# Patient Record
Sex: Female | Born: 1940 | Race: White | Hispanic: No | Smoking: Former smoker
Health system: Southern US, Community
[De-identification: ages and names within clinical notes are randomized; demographics above are authoritative.]

## PROBLEM LIST (undated history)

## (undated) DIAGNOSIS — M1990 Unspecified osteoarthritis, unspecified site: Secondary | ICD-10-CM

## (undated) DIAGNOSIS — M797 Fibromyalgia: Secondary | ICD-10-CM

## (undated) HISTORY — PX: BRONCHOSCOPY: SUR163

## (undated) HISTORY — PX: OTHER SURGICAL HISTORY: SHX169

## (undated) HISTORY — PX: TONSILLECTOMY: SUR1361

---

## 1998-05-19 ENCOUNTER — Other Ambulatory Visit: Admission: RE | Admit: 1998-05-19 | Discharge: 1998-05-19 | Payer: Self-pay | Admitting: Obstetrics and Gynecology

## 1998-07-03 ENCOUNTER — Other Ambulatory Visit: Admission: RE | Admit: 1998-07-03 | Discharge: 1998-07-03 | Payer: Self-pay | Admitting: Obstetrics and Gynecology

## 1998-08-26 ENCOUNTER — Encounter: Payer: Self-pay | Admitting: Obstetrics and Gynecology

## 1998-08-26 ENCOUNTER — Ambulatory Visit (HOSPITAL_COMMUNITY): Admission: RE | Admit: 1998-08-26 | Discharge: 1998-08-26 | Payer: Self-pay | Admitting: Obstetrics and Gynecology

## 1999-08-09 ENCOUNTER — Other Ambulatory Visit: Admission: RE | Admit: 1999-08-09 | Discharge: 1999-08-09 | Payer: Self-pay | Admitting: Obstetrics and Gynecology

## 1999-09-28 ENCOUNTER — Ambulatory Visit (HOSPITAL_COMMUNITY): Admission: RE | Admit: 1999-09-28 | Discharge: 1999-09-28 | Payer: Self-pay | Admitting: Obstetrics and Gynecology

## 1999-09-28 ENCOUNTER — Encounter: Payer: Self-pay | Admitting: Obstetrics and Gynecology

## 2000-09-21 ENCOUNTER — Other Ambulatory Visit: Admission: RE | Admit: 2000-09-21 | Discharge: 2000-09-21 | Payer: Self-pay | Admitting: Obstetrics and Gynecology

## 2000-10-04 ENCOUNTER — Encounter: Payer: Self-pay | Admitting: Obstetrics and Gynecology

## 2000-10-04 ENCOUNTER — Ambulatory Visit (HOSPITAL_COMMUNITY): Admission: RE | Admit: 2000-10-04 | Discharge: 2000-10-04 | Payer: Self-pay | Admitting: Obstetrics and Gynecology

## 2000-10-29 ENCOUNTER — Emergency Department (HOSPITAL_COMMUNITY): Admission: EM | Admit: 2000-10-29 | Discharge: 2000-10-29 | Payer: Self-pay | Admitting: Emergency Medicine

## 2000-10-30 ENCOUNTER — Encounter: Payer: Self-pay | Admitting: Emergency Medicine

## 2000-10-30 ENCOUNTER — Emergency Department (HOSPITAL_COMMUNITY): Admission: EM | Admit: 2000-10-30 | Discharge: 2000-10-30 | Payer: Self-pay | Admitting: Emergency Medicine

## 2001-12-18 ENCOUNTER — Other Ambulatory Visit: Admission: RE | Admit: 2001-12-18 | Discharge: 2001-12-18 | Payer: Self-pay | Admitting: Obstetrics and Gynecology

## 2003-03-13 ENCOUNTER — Other Ambulatory Visit: Admission: RE | Admit: 2003-03-13 | Discharge: 2003-03-13 | Payer: Self-pay | Admitting: Obstetrics and Gynecology

## 2004-03-16 ENCOUNTER — Emergency Department (HOSPITAL_COMMUNITY): Admission: EM | Admit: 2004-03-16 | Discharge: 2004-03-16 | Payer: Self-pay | Admitting: Emergency Medicine

## 2004-03-31 ENCOUNTER — Emergency Department (HOSPITAL_COMMUNITY): Admission: EM | Admit: 2004-03-31 | Discharge: 2004-03-31 | Payer: Self-pay | Admitting: Family Medicine

## 2004-04-01 ENCOUNTER — Ambulatory Visit (HOSPITAL_COMMUNITY): Admission: RE | Admit: 2004-04-01 | Discharge: 2004-04-01 | Payer: Self-pay | Admitting: Family Medicine

## 2004-04-02 ENCOUNTER — Ambulatory Visit (HOSPITAL_COMMUNITY): Admission: RE | Admit: 2004-04-02 | Discharge: 2004-04-02 | Payer: Self-pay | Admitting: Family Medicine

## 2004-04-13 ENCOUNTER — Ambulatory Visit (HOSPITAL_COMMUNITY): Admission: RE | Admit: 2004-04-13 | Discharge: 2004-04-13 | Payer: Self-pay | Admitting: Thoracic Surgery

## 2004-04-15 ENCOUNTER — Encounter (INDEPENDENT_AMBULATORY_CARE_PROVIDER_SITE_OTHER): Payer: Self-pay | Admitting: *Deleted

## 2004-04-15 ENCOUNTER — Ambulatory Visit (HOSPITAL_COMMUNITY): Admission: RE | Admit: 2004-04-15 | Discharge: 2004-04-15 | Payer: Self-pay | Admitting: Thoracic Surgery

## 2004-05-05 ENCOUNTER — Encounter: Admission: RE | Admit: 2004-05-05 | Discharge: 2004-05-05 | Payer: Self-pay | Admitting: Thoracic Surgery

## 2004-06-16 ENCOUNTER — Encounter: Admission: RE | Admit: 2004-06-16 | Discharge: 2004-06-16 | Payer: Self-pay | Admitting: Thoracic Surgery

## 2004-06-21 ENCOUNTER — Other Ambulatory Visit: Admission: RE | Admit: 2004-06-21 | Discharge: 2004-06-21 | Payer: Self-pay | Admitting: Obstetrics and Gynecology

## 2005-06-13 IMAGING — CR DG CHEST 2V
2 series · 2 of 2 positions shown · non-contrast
Comparison: Chest radiographs dated 04/15/04 and chest CT dated 04/02/04.

CLINICAL DATA: Status post bronchoscopy for right middle lobe and right lower lobe lesions.
 TWO VIEW CHEST ? 05/05/04:

[view not recorded (1 of 2)]
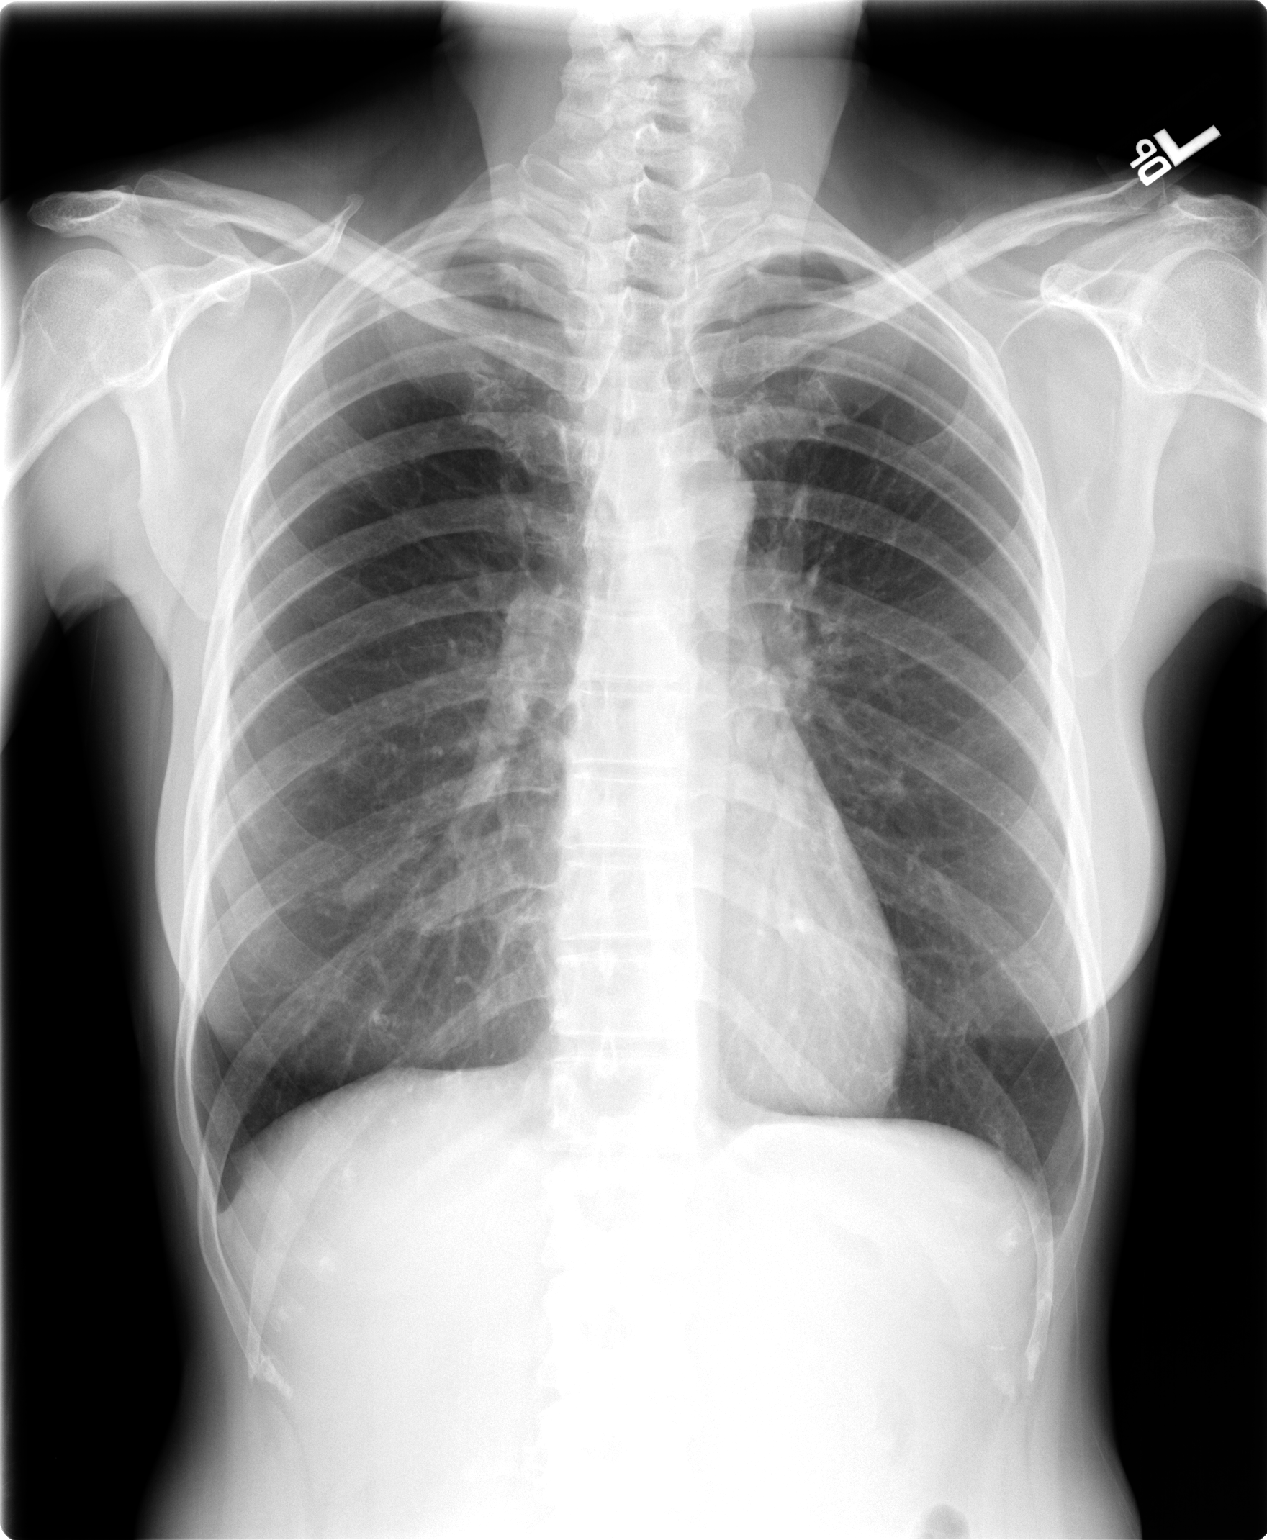

[view not recorded (2 of 2)]
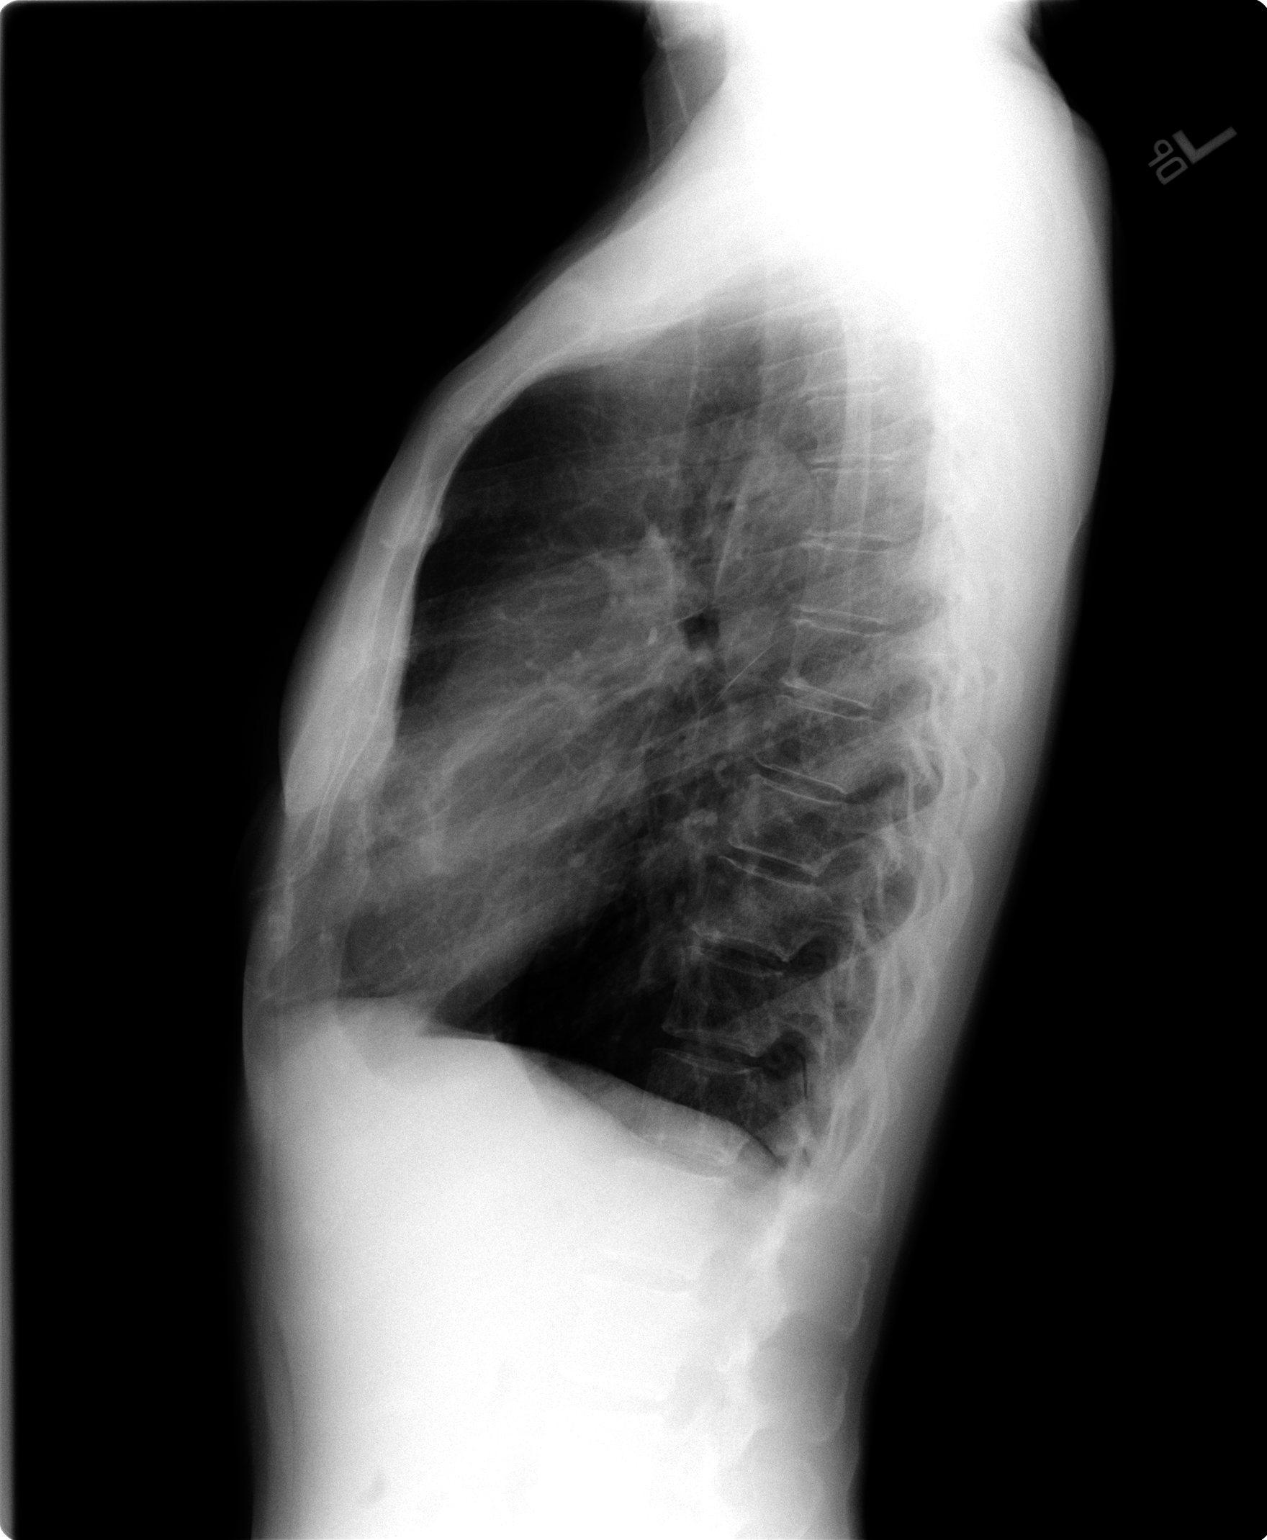

[2 of 2 positions shown; findings below may reference images not displayed]

Patchy density in the anterior aspect of the right middle lobe with an interval decrease in density with an appearance compatible with interval partial aeration of this region.  The nodular densities seen on the CT in the right lower lobe are not visible on the radiographs.  Stable mild changes of COPD.  Normal sized heart.  Mild thoracic spine degenerative changes.
IMPRESSION: 1.  Patchy opacity in the anterior aspect of the right middle lobe with interval partial aeration of that area, suggesting improving pneumonia or inflammatory change.
 2.  Stable mild changes of COPD.

## 2005-08-25 ENCOUNTER — Other Ambulatory Visit: Admission: RE | Admit: 2005-08-25 | Discharge: 2005-08-25 | Payer: Self-pay | Admitting: Obstetrics and Gynecology

## 2005-12-27 ENCOUNTER — Ambulatory Visit: Payer: Self-pay | Admitting: Nurse Practitioner

## 2005-12-28 ENCOUNTER — Ambulatory Visit: Payer: Self-pay | Admitting: *Deleted

## 2006-01-03 ENCOUNTER — Ambulatory Visit (HOSPITAL_COMMUNITY): Admission: RE | Admit: 2006-01-03 | Discharge: 2006-01-03 | Payer: Self-pay | Admitting: Family Medicine

## 2006-01-20 ENCOUNTER — Ambulatory Visit: Payer: Self-pay | Admitting: Nurse Practitioner

## 2006-01-20 ENCOUNTER — Ambulatory Visit (HOSPITAL_COMMUNITY): Admission: RE | Admit: 2006-01-20 | Discharge: 2006-01-20 | Payer: Self-pay | Admitting: Nurse Practitioner

## 2006-02-06 ENCOUNTER — Ambulatory Visit: Payer: Self-pay | Admitting: Nurse Practitioner

## 2006-03-06 ENCOUNTER — Ambulatory Visit: Payer: Self-pay | Admitting: Nurse Practitioner

## 2006-03-24 ENCOUNTER — Ambulatory Visit: Payer: Self-pay | Admitting: Nurse Practitioner

## 2006-11-30 ENCOUNTER — Other Ambulatory Visit: Admission: RE | Admit: 2006-11-30 | Discharge: 2006-11-30 | Payer: Self-pay | Admitting: Obstetrics and Gynecology

## 2006-12-19 ENCOUNTER — Ambulatory Visit: Payer: Self-pay | Admitting: Internal Medicine

## 2007-02-28 ENCOUNTER — Encounter (INDEPENDENT_AMBULATORY_CARE_PROVIDER_SITE_OTHER): Payer: Self-pay | Admitting: *Deleted

## 2007-02-28 IMAGING — CR DG ANKLE COMPLETE 3+V*R*
3 series · 3 of 3 positions shown · non-contrast
Comparison: none

CLINICAL DATA: Right foot and ankle pain.
 RIGHT FOOT - 3 VIEW:
 There is mild degenerative change at the first metatarsal phalangeal articulation.  Additionally, mild degenerative changes of the tibiotalar articulation.  Negative for fracture.

[t ankle joint ap right]
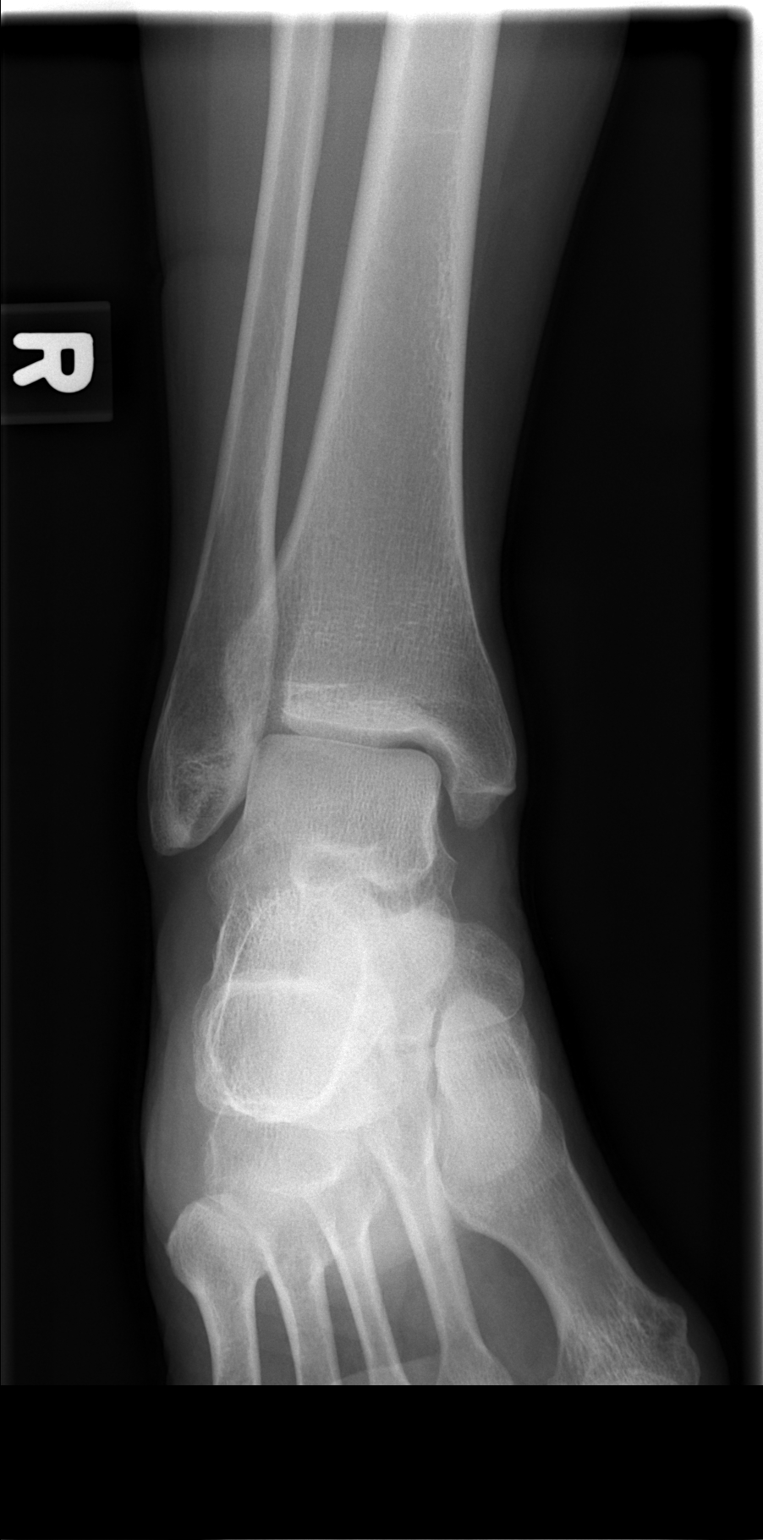

[t ankle joint oblique right]
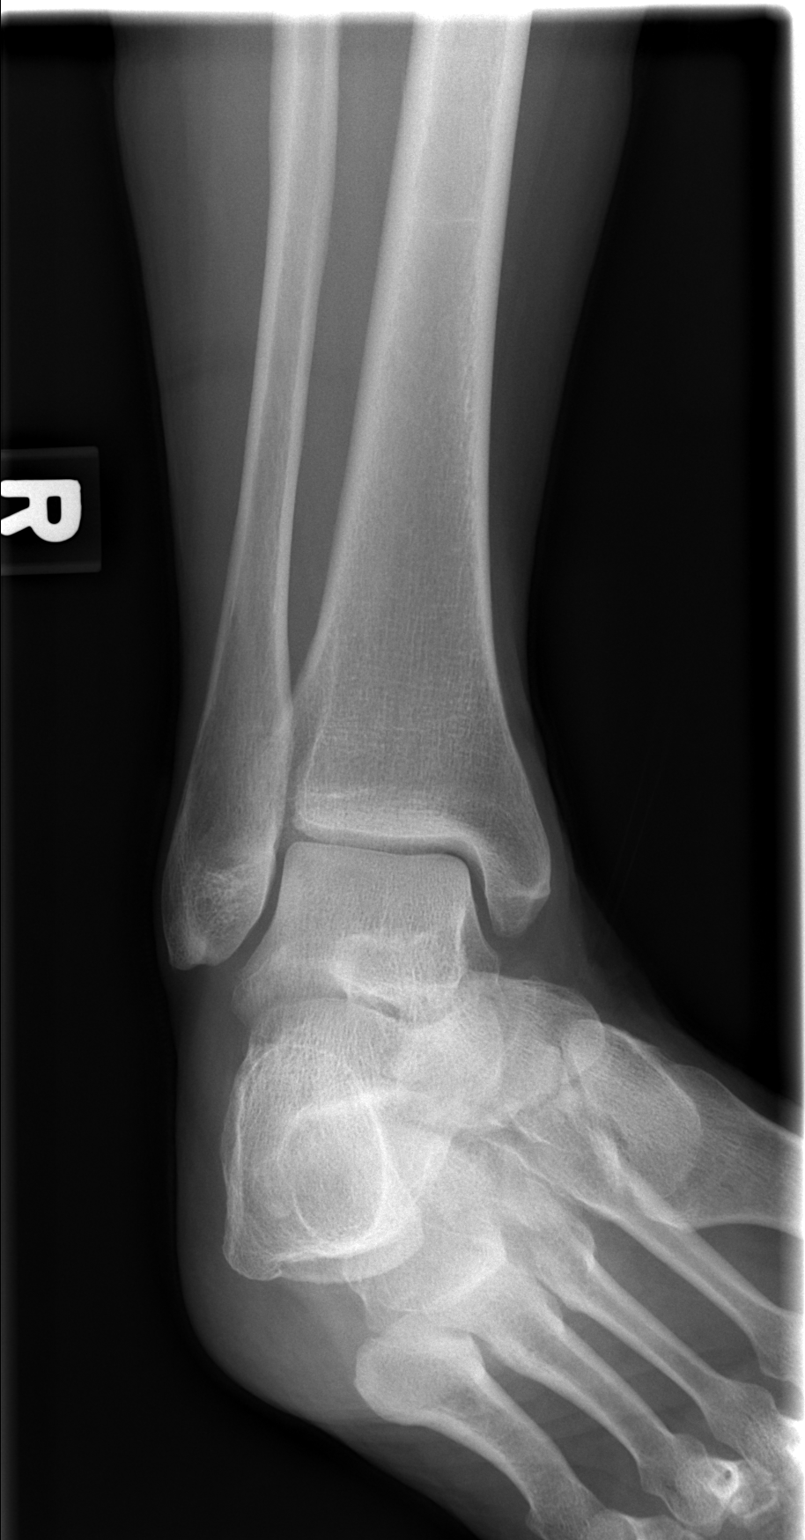

[t ankle joint lat right]
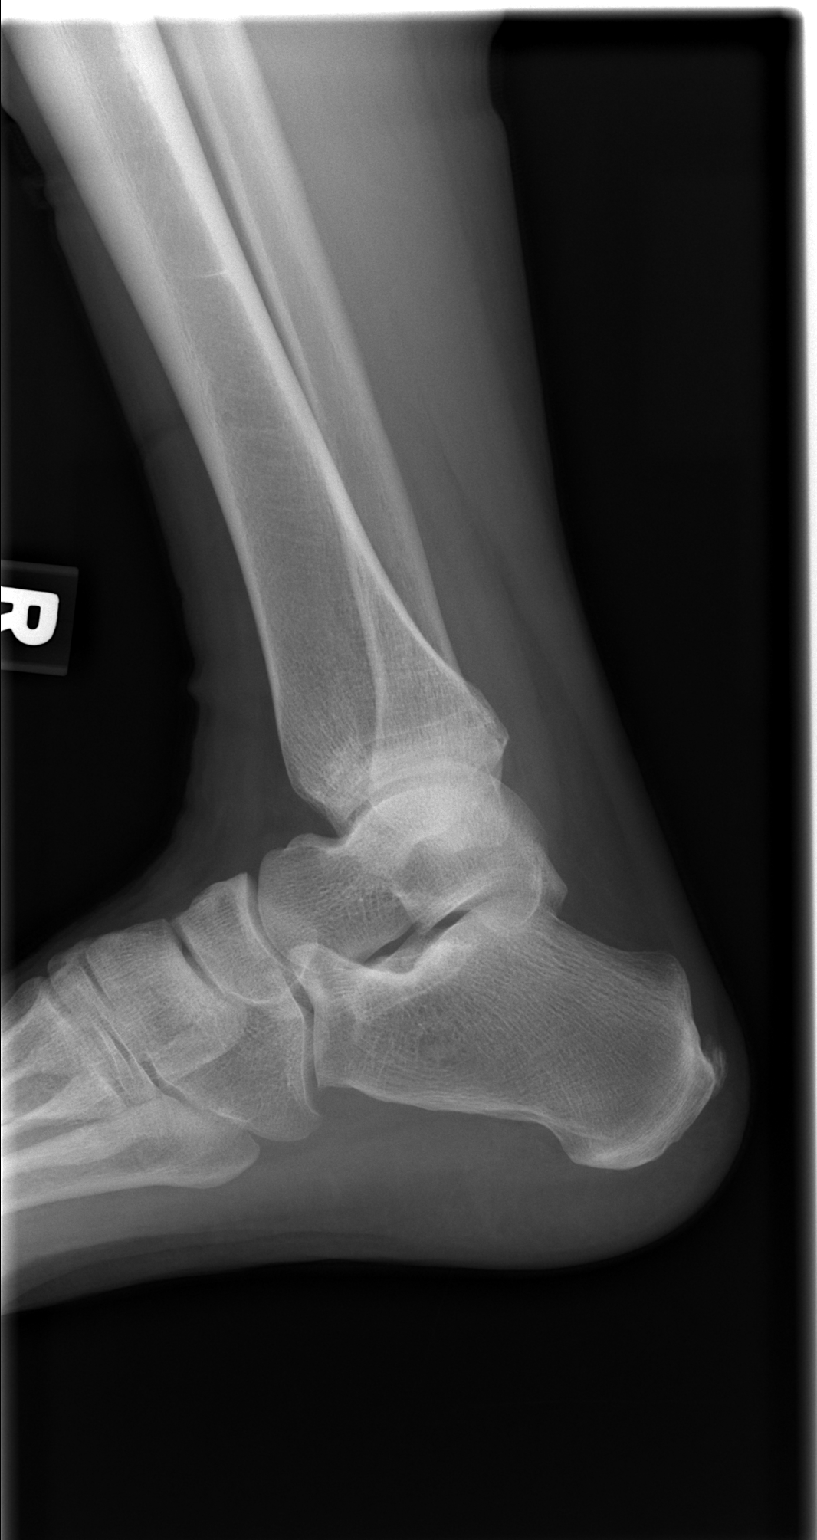

[3 of 3 positions shown; findings below may reference images not displayed]

IMPRESSION: Degenerative changes without fracture.
 RIGHT ANKLE - 3 VIEW:
FINDINGS: Mild tibiotalar degenerative changes noted.  Cortical irregularity posterior tibia.  I question whether this is related to chronic injury.  Negative for acute fracture.  Subtalar articulation is normal.  A lucency within the calcaneus with central nidus likely reflects an incidental intraosseous lipoma.
IMPRESSION: Degenerative changes at the tibiotalar articulation.  
 Probable incidental lipoma within the calcaneus.

## 2007-03-15 ENCOUNTER — Ambulatory Visit: Payer: Self-pay | Admitting: Family Medicine

## 2007-03-15 ENCOUNTER — Encounter (INDEPENDENT_AMBULATORY_CARE_PROVIDER_SITE_OTHER): Payer: Self-pay | Admitting: Nurse Practitioner

## 2007-05-15 ENCOUNTER — Emergency Department (HOSPITAL_COMMUNITY): Admission: EM | Admit: 2007-05-15 | Discharge: 2007-05-15 | Payer: Self-pay | Admitting: Family Medicine

## 2007-08-10 ENCOUNTER — Ambulatory Visit (HOSPITAL_COMMUNITY): Admission: RE | Admit: 2007-08-10 | Discharge: 2007-08-10 | Payer: Self-pay | Admitting: Family Medicine

## 2007-08-24 ENCOUNTER — Encounter: Admission: RE | Admit: 2007-08-24 | Discharge: 2007-08-24 | Payer: Self-pay | Admitting: Family Medicine

## 2008-03-27 ENCOUNTER — Emergency Department (HOSPITAL_COMMUNITY): Admission: EM | Admit: 2008-03-27 | Discharge: 2008-03-27 | Payer: Self-pay | Admitting: Family Medicine

## 2010-10-29 NOTE — Op Note (Signed)
NAME:  Lauren Terrell, Lauren Terrell                 ACCOUNT NO.:  [id number]   MEDICAL RECORD NO.:  [id number]          PATIENT TYPE:  OUT   LOCATION:  XRAY                         FACILITY:  [facility]   PHYSICIAN:  Ines Bloomer, M.D. DATE OF BIRTH:  [birth year]   DATE OF PROCEDURE:  04/15/2004  DATE OF DISCHARGE:  04/13/2004                                 OPERATIVE REPORT   PREOPERATIVE DIAGNOSIS:  Right middle lobe and right lower lobe nodules.   POSTOPERATIVE DIAGNOSIS:  Right middle lobe and right lower lobe nodules.   OPERATION PERFORMED:  Video bronchoscopy.   SURGEON:  Ines Bloomer, M.D.   ANESTHESIA:  MAC.  Cetacaine and Xylocaine and IV sedation.   INDICATIONS FOR PROCEDURE:  This patient had multiple nodules in the right  middle lobe and the right lower lobe that appeared may possibly be  inflammatory.  There is a question she may have a right middle lobe  occlusion and so is scheduled for video bronchoscopy for diagnosis and  culture.   DESCRIPTION OF PROCEDURE:  The video bronchoscope was passed through the  mouth.  The cords were normal.  The trachea was normal. The left main stem,  left upper lobe, and left lower lobe orifices were normal.  The right  mainstem, right upper lobe orifices were normal.  On the right middle lobe  no endobronchial lesions were seen.  There was maybe some slight narrowing.  Washings and brushings were taken from the right middle lobe and the right  lower lobe.  There was nothing to biopsy.  Since there was nothing  intrabronchial to biopsy, we did some transbronchial biopsies in the area of  the greatest nodules in the right middle lobe as well as the right lower  lobe.  The patient tolerated the procedure.  The video bronchoscope was  removed.  The patient tolerated the procedure well and was returned to the  recovery room in stable condition.       DPB/MEDQ  D:  05/31/2004  T:  06/01/2004  Job:  [id number]

## 2012-06-28 DIAGNOSIS — IMO0001 Reserved for inherently not codable concepts without codable children: Secondary | ICD-10-CM | POA: Diagnosis not present

## 2012-06-28 DIAGNOSIS — I776 Arteritis, unspecified: Secondary | ICD-10-CM | POA: Diagnosis not present

## 2012-11-07 DIAGNOSIS — H2510 Age-related nuclear cataract, unspecified eye: Secondary | ICD-10-CM | POA: Diagnosis not present

## 2012-11-26 DIAGNOSIS — H2510 Age-related nuclear cataract, unspecified eye: Secondary | ICD-10-CM | POA: Diagnosis not present

## 2012-12-04 DIAGNOSIS — H2510 Age-related nuclear cataract, unspecified eye: Secondary | ICD-10-CM | POA: Diagnosis not present

## 2012-12-10 DIAGNOSIS — H2510 Age-related nuclear cataract, unspecified eye: Secondary | ICD-10-CM | POA: Diagnosis not present

## 2013-02-06 ENCOUNTER — Ambulatory Visit (INDEPENDENT_AMBULATORY_CARE_PROVIDER_SITE_OTHER): Payer: Medicare Other | Admitting: Sports Medicine

## 2013-02-06 VITALS — BP 120/86 | Ht 61.0 in | Wt 115.0 lb

## 2013-02-06 DIAGNOSIS — M533 Sacrococcygeal disorders, not elsewhere classified: Secondary | ICD-10-CM | POA: Diagnosis not present

## 2013-02-06 MED ORDER — METHYLPREDNISOLONE ACETATE 80 MG/ML IJ SUSP
80.0000 mg | Freq: Once | INTRAMUSCULAR | Status: AC
  Administered 2013-02-06: 80 mg via INTRAMUSCULAR
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
## 2013-02-14 DIAGNOSIS — M069 Rheumatoid arthritis, unspecified: Secondary | ICD-10-CM | POA: Diagnosis not present

## 2013-02-14 DIAGNOSIS — E28319 Asymptomatic premature menopause: Secondary | ICD-10-CM | POA: Diagnosis not present

## 2013-05-01 DIAGNOSIS — Z23 Encounter for immunization: Secondary | ICD-10-CM | POA: Diagnosis not present

## 2013-07-08 DIAGNOSIS — E785 Hyperlipidemia, unspecified: Secondary | ICD-10-CM | POA: Diagnosis not present

## 2013-07-08 DIAGNOSIS — E28319 Asymptomatic premature menopause: Secondary | ICD-10-CM | POA: Diagnosis not present

## 2013-07-11 DIAGNOSIS — E785 Hyperlipidemia, unspecified: Secondary | ICD-10-CM | POA: Diagnosis not present

## 2013-07-11 DIAGNOSIS — R7309 Other abnormal glucose: Secondary | ICD-10-CM | POA: Diagnosis not present

## 2013-07-11 DIAGNOSIS — R5381 Other malaise: Secondary | ICD-10-CM | POA: Diagnosis not present

## 2013-07-11 DIAGNOSIS — R5383 Other fatigue: Secondary | ICD-10-CM | POA: Diagnosis not present

## 2013-08-16 DIAGNOSIS — IMO0001 Reserved for inherently not codable concepts without codable children: Secondary | ICD-10-CM | POA: Diagnosis not present

## 2013-08-16 DIAGNOSIS — M1990 Unspecified osteoarthritis, unspecified site: Secondary | ICD-10-CM | POA: Diagnosis not present

## 2014-01-23 DIAGNOSIS — Z23 Encounter for immunization: Secondary | ICD-10-CM | POA: Diagnosis not present

## 2014-01-23 DIAGNOSIS — Z780 Asymptomatic menopausal state: Secondary | ICD-10-CM | POA: Diagnosis not present

## 2014-01-23 DIAGNOSIS — IMO0001 Reserved for inherently not codable concepts without codable children: Secondary | ICD-10-CM | POA: Diagnosis not present

## 2014-01-23 DIAGNOSIS — G4700 Insomnia, unspecified: Secondary | ICD-10-CM | POA: Diagnosis not present

## 2014-03-11 DIAGNOSIS — Z23 Encounter for immunization: Secondary | ICD-10-CM | POA: Diagnosis not present

## 2014-07-30 ENCOUNTER — Other Ambulatory Visit: Payer: Self-pay | Admitting: Obstetrics & Gynecology

## 2014-07-30 ENCOUNTER — Other Ambulatory Visit (HOSPITAL_COMMUNITY)
Admission: RE | Admit: 2014-07-30 | Discharge: 2014-07-30 | Disposition: A | Discharge status: Home / Self Care | Payer: Medicare Other | Source: Ambulatory Visit | Attending: Obstetrics & Gynecology | Admitting: Obstetrics & Gynecology

## 2014-07-30 DIAGNOSIS — N939 Abnormal uterine and vaginal bleeding, unspecified: Secondary | ICD-10-CM | POA: Diagnosis not present

## 2014-07-30 DIAGNOSIS — N959 Unspecified menopausal and perimenopausal disorder: Secondary | ICD-10-CM | POA: Diagnosis not present

## 2014-07-30 DIAGNOSIS — Z124 Encounter for screening for malignant neoplasm of cervix: Secondary | ICD-10-CM | POA: Insufficient documentation

## 2014-07-30 DIAGNOSIS — Z01411 Encounter for gynecological examination (general) (routine) with abnormal findings: Secondary | ICD-10-CM | POA: Diagnosis not present

## 2014-07-30 DIAGNOSIS — N63 Unspecified lump in breast: Secondary | ICD-10-CM | POA: Diagnosis not present

## 2014-07-30 DIAGNOSIS — Z1151 Encounter for screening for human papillomavirus (HPV): Secondary | ICD-10-CM | POA: Insufficient documentation

## 2014-07-30 DIAGNOSIS — F604 Histrionic personality disorder: Secondary | ICD-10-CM | POA: Diagnosis not present

## 2014-07-31 ENCOUNTER — Other Ambulatory Visit: Payer: Self-pay | Admitting: Obstetrics & Gynecology

## 2014-07-31 DIAGNOSIS — N6310 Unspecified lump in the right breast, unspecified quadrant: Secondary | ICD-10-CM

## 2014-08-01 LAB — CYTOLOGY - PAP

## 2014-08-06 ENCOUNTER — Other Ambulatory Visit: Payer: Medicare Other

## 2014-08-13 ENCOUNTER — Ambulatory Visit
Admission: RE | Admit: 2014-08-13 | Discharge: 2014-08-13 | Disposition: A | Discharge status: Home / Self Care | Payer: Medicare Other | Source: Ambulatory Visit | Attending: Obstetrics & Gynecology | Admitting: Obstetrics & Gynecology

## 2014-08-13 DIAGNOSIS — N6310 Unspecified lump in the right breast, unspecified quadrant: Secondary | ICD-10-CM

## 2014-08-13 DIAGNOSIS — N6011 Diffuse cystic mastopathy of right breast: Secondary | ICD-10-CM | POA: Diagnosis not present

## 2014-08-27 DIAGNOSIS — N959 Unspecified menopausal and perimenopausal disorder: Secondary | ICD-10-CM | POA: Diagnosis not present

## 2014-08-27 DIAGNOSIS — R938 Abnormal findings on diagnostic imaging of other specified body structures: Secondary | ICD-10-CM | POA: Diagnosis not present

## 2014-08-27 DIAGNOSIS — N939 Abnormal uterine and vaginal bleeding, unspecified: Secondary | ICD-10-CM | POA: Diagnosis not present

## 2014-08-27 DIAGNOSIS — Z780 Asymptomatic menopausal state: Secondary | ICD-10-CM | POA: Diagnosis not present

## 2014-09-19 DIAGNOSIS — N939 Abnormal uterine and vaginal bleeding, unspecified: Secondary | ICD-10-CM | POA: Diagnosis not present

## 2014-09-21 NOTE — H&P (Signed)
GYN H&P 74yo G2P2 who presents for hysteroscopy, D&C, polypectomy due to abnormal uterine bleeding. In review, the patient has been on HRT for over 88yrs, with vaginal bleeding q 30 days-lasting for 3-4 days with two days of moderate bleeding. Suspect bleeding is due to progesterone withdrawal; however in light of this bleeding with no prior work up. A TVUS was peformed on 3/16 that showed: Anteverted uterus measuring 9.5cmx4.95cmx5.4cm. Two small fibroids each measuring <2cm, endometrium thickened 0.9cm with questionable hyperechoic mass on the the posterior wall 0.7x0.5cm, no BF noted. Small follicle in left ovary, otherwise adnexal unremarkable. Due to the patient's desire to continue with HRT, this treatment has been discontinued until a full work up of her bleeding has been performed.   Current Medications  Taking   Melatonin 5 MG Tablet 1 tablet at bedtime as needed with food Once a day   Unisom(Doxylamine Succinate) 25 MG Tablet 1 tablet at bedtime as needed Once a day   Diclofenac Sodium 75 MG Tablet Delayed Release 1 tablet Once a day   Lyrica(Pregabalin) 75 MG Capsule 1 capsule once a day   Discontinued   Estradiol 1 MG Tablet 1 tablet once a day   MedroxyPROGESTERone Acetate 5 MG Tablet 1 tablet, on the 18th day of every month until the 30th Once a day   Medication List reviewed and reconciled with the patient    Past Medical History  Fibromyalgia  Genital herpes  Urinary incontinence since 2010  Insomnia  Histrionic personality   Surgical History  cataracts in both eyes 2014  C section 89  bronchosocopy    Family History  Father: deceased  Mother: deceased  Father had ulcers. , denies any GYN family cancer hx.   Social History  General:  Tobacco use  cigarettes: Former smoker quit 10 years ago Alcohol: wine, 2 glasses per night.  Caffeine: no.  Recreational drug use: no.  Diet: 5 servings of fruits and veggies daily, drinks water, salmon, sardines.  Exercise: walk,  light weights and stretching, daily.  Occupation: retired.  Education: [employer].  Marital Status: Divorced.  Children: 2 sons.    Gyn History  Sexual activity not currently sexually active.  Periods : menses never discontinued.  LMP 07/16/2014.  Last pap smear date 07/30/2014.  Last mammogram date 08/2014.  Denies H/O Abnormal pap smear.    OB History  Pregnancy # 1 live birth, vaginal delivery, boy.  Pregnancy # 2 live birth, C-section, boy.    Allergies  Penicillin: anaphylaxis: Allergy  Ibuprofen: stomach upset: Side Effects   Hospitalization/Major Diagnostic Procedure  No Hospitalization History.   Review of Systems  CONSTITUTIONAL:  no Chills. no Fever. no Skin rash.  HEENT:  Blurrred vision no.  CARDIOLOGY:  no Chest pain.  RESPIRATORY:  no Shortness of breath. no Cough.  GASTROENTEROLOGY:  no Abdominal pain. no Appetite change. no Change in bowel movements.  UROLOGY:  no Urinary frequency. no Urinary incontinence. no Urinary urgency.  FEMALE REPRODUCTIVE:  no Breast lumps or discharge. no Breast pain.  NEUROLOGY:  no Dizziness. no Headache.     Examination performed in office 09/19/14: Vital Signs  Wt 117.5, Wt change -.5 lb, Ht 65.5, BMI 19.25, Pulse sitting 68, BP sitting 131/79.   Examination  General Examination: GENERAL APPEARANCE alert, oriented, NAD, pleasant.  SKIN: normal, no rash.  LUNGS: clear to auscultation bilaterally, no wheezes, rhonchi, rales.  HEART: no murmurs, regular rate and rhythm.  ABDOMEN: no masses palpated, soft and not tender, no  rebound, no guarding.  FEMALE GENITOURINARY: No external lesions, Vagina - pink moist mucosa, no lesions or abnormal discharge, cervix - closed, no discharge or lesions. No CMT. No adnexal masses bilaterally. Uterus: nontender and normal size on palpation.  EXTREMITIES: no edema present, no calf tenderness bilaterally.     A/P: 74yo G2P2 who presents for hysteroscopy, D&C, possible polypectomy  due to abnormal uterine bleeding and possible uterine polyp -NPO -LR @ 125cc/hr -CBC prior to procedure -SCDs to OR -Discussed risks and benefits of surgery- including risk of bleeding, infection and uterine perforation. Discussed indications of surgery and pt wishes to proceed with surgical management.   Myna HidalgoJennifer Wallace Gappa, DO [phone] (pager) [phone] (office)

## 2014-09-22 ENCOUNTER — Encounter (HOSPITAL_COMMUNITY)
Admission: RE | Admit: 2014-09-22 | Discharge: 2014-09-22 | Disposition: A | Discharge status: Home / Self Care | Payer: Medicare Other | Source: Ambulatory Visit | Attending: Obstetrics & Gynecology | Admitting: Obstetrics & Gynecology

## 2014-09-22 ENCOUNTER — Inpatient Hospital Stay (HOSPITAL_COMMUNITY): Admission: RE | Admit: 2014-09-22 | Payer: Medicare Other | Source: Ambulatory Visit

## 2014-09-22 ENCOUNTER — Other Ambulatory Visit: Payer: Self-pay

## 2014-09-22 ENCOUNTER — Encounter (HOSPITAL_COMMUNITY): Payer: Self-pay

## 2014-09-22 DIAGNOSIS — Z01818 Encounter for other preprocedural examination: Secondary | ICD-10-CM | POA: Insufficient documentation

## 2014-09-22 HISTORY — DX: Unspecified osteoarthritis, unspecified site: M19.90

## 2014-09-22 HISTORY — DX: Fibromyalgia: M79.7

## 2014-09-22 LAB — CBC
HEMATOCRIT: 34.9 % — AB (ref 36.0–46.0)
Hemoglobin: 11.8 g/dL — ABNORMAL LOW (ref 12.0–15.0)
MCH: 32.2 pg (ref 26.0–34.0)
MCHC: 33.8 g/dL (ref 30.0–36.0)
MCV: 95.4 fL (ref 78.0–100.0)
Platelets: 193 10*3/uL (ref 150–400)
RBC: 3.66 MIL/uL — ABNORMAL LOW (ref 3.87–5.11)
RDW: 15.2 % (ref 11.5–15.5)
WBC: 5.2 10*3/uL (ref 4.0–10.5)

## 2014-09-22 NOTE — Patient Instructions (Addendum)
Your procedure is scheduled on:10/01/14  Enter through the Main Entrance at :1230pm Pick up desk phone and dial [phone] and inform us of your arrival.  Please call [phone] if you have any problems the morning of surgery.  Remember: Do not eat food after midnight:Tuesday Clear liquids ok until 10 am on WED   You may brush your teeth the morning of surgery.   DO NOT wear jewelry, eye make-up, lipstick,body lotion, or dark fingernail polish.  (Polished toes are ok) You may wear deodorant.  If you are to be admitted after surgery, leave suitcase in car until your room has been assigned. Patients discharged on the day of surgery will not be allowed to drive home. Wear loose fitting, comfortable clothes for your ride home.

## 2014-10-01 ENCOUNTER — Encounter (HOSPITAL_COMMUNITY): Payer: Self-pay | Admitting: Certified Registered Nurse Anesthetist

## 2014-10-01 ENCOUNTER — Ambulatory Visit (HOSPITAL_COMMUNITY): Payer: Medicare Other | Admitting: Anesthesiology

## 2014-10-01 ENCOUNTER — Ambulatory Visit (HOSPITAL_COMMUNITY)
Admission: RE | Admit: 2014-10-01 | Discharge: 2014-10-01 | Disposition: A | Discharge status: Home / Self Care | Payer: Medicare Other | Source: Ambulatory Visit | Attending: Obstetrics & Gynecology | Admitting: Obstetrics & Gynecology

## 2014-10-01 ENCOUNTER — Encounter (HOSPITAL_COMMUNITY)
Admission: RE | Disposition: A | Discharge status: Home / Self Care | Payer: Self-pay | Source: Ambulatory Visit | Attending: Obstetrics & Gynecology

## 2014-10-01 DIAGNOSIS — R938 Abnormal findings on diagnostic imaging of other specified body structures: Secondary | ICD-10-CM | POA: Insufficient documentation

## 2014-10-01 DIAGNOSIS — N840 Polyp of corpus uteri: Secondary | ICD-10-CM | POA: Diagnosis not present

## 2014-10-01 DIAGNOSIS — Z87891 Personal history of nicotine dependence: Secondary | ICD-10-CM | POA: Diagnosis not present

## 2014-10-01 DIAGNOSIS — N888 Other specified noninflammatory disorders of cervix uteri: Secondary | ICD-10-CM | POA: Insufficient documentation

## 2014-10-01 DIAGNOSIS — N939 Abnormal uterine and vaginal bleeding, unspecified: Secondary | ICD-10-CM | POA: Diagnosis not present

## 2014-10-01 DIAGNOSIS — M797 Fibromyalgia: Secondary | ICD-10-CM | POA: Diagnosis not present

## 2014-10-01 DIAGNOSIS — G4700 Insomnia, unspecified: Secondary | ICD-10-CM | POA: Insufficient documentation

## 2014-10-01 DIAGNOSIS — Z79899 Other long term (current) drug therapy: Secondary | ICD-10-CM | POA: Insufficient documentation

## 2014-10-01 DIAGNOSIS — M1990 Unspecified osteoarthritis, unspecified site: Secondary | ICD-10-CM | POA: Diagnosis not present

## 2014-10-01 DIAGNOSIS — Z791 Long term (current) use of non-steroidal anti-inflammatories (NSAID): Secondary | ICD-10-CM | POA: Diagnosis not present

## 2014-10-01 DIAGNOSIS — N950 Postmenopausal bleeding: Secondary | ICD-10-CM | POA: Diagnosis present

## 2014-10-01 HISTORY — PX: DILATION AND CURETTAGE OF UTERUS: SHX78

## 2014-10-01 HISTORY — PX: HYSTEROSCOPY: SHX211

## 2014-10-01 SURGERY — DILATION AND CURETTAGE
Anesthesia: General | Site: Vagina

## 2014-10-01 MED ORDER — FENTANYL CITRATE (PF) 250 MCG/5ML IJ SOLN
INTRAMUSCULAR | Status: AC
  Filled 2014-10-01: qty 5

## 2014-10-01 MED ORDER — DEXAMETHASONE SODIUM PHOSPHATE 4 MG/ML IJ SOLN
INTRAMUSCULAR | Status: AC
  Filled 2014-10-01: qty 1

## 2014-10-01 MED ORDER — LIDOCAINE HCL (CARDIAC) 20 MG/ML IV SOLN
INTRAVENOUS | Status: DC | PRN
  Administered 2014-10-01: 100 mg via INTRAVENOUS

## 2014-10-01 MED ORDER — MIDAZOLAM HCL 2 MG/2ML IJ SOLN
INTRAMUSCULAR | Status: AC
  Filled 2014-10-01: qty 2

## 2014-10-01 MED ORDER — OXYCODONE-ACETAMINOPHEN 5-325 MG PO TABS: 1.0000 | ORAL_TABLET | Freq: Three times a day (TID) | ORAL | Status: AC | PRN

## 2014-10-01 MED ORDER — DEXAMETHASONE SODIUM PHOSPHATE 10 MG/ML IJ SOLN
INTRAMUSCULAR | Status: DC | PRN
  Administered 2014-10-01: 4 mg via INTRAVENOUS

## 2014-10-01 MED ORDER — LACTATED RINGERS IV SOLN: INTRAVENOUS | Status: DC

## 2014-10-01 MED ORDER — ONDANSETRON HCL 4 MG/2ML IJ SOLN
INTRAMUSCULAR | Status: AC
  Filled 2014-10-01: qty 2

## 2014-10-01 MED ORDER — LACTATED RINGERS IV SOLN
INTRAVENOUS | Status: DC
  Administered 2014-10-01: 13:00:00 via INTRAVENOUS

## 2014-10-01 MED ORDER — ONDANSETRON HCL 4 MG/2ML IJ SOLN
INTRAMUSCULAR | Status: DC | PRN
  Administered 2014-10-01: 4 mg via INTRAVENOUS

## 2014-10-01 MED ORDER — MIDAZOLAM HCL 2 MG/2ML IJ SOLN
INTRAMUSCULAR | Status: DC | PRN
  Administered 2014-10-01: 2 mg via INTRAVENOUS

## 2014-10-01 MED ORDER — FENTANYL CITRATE (PF) 100 MCG/2ML IJ SOLN
INTRAMUSCULAR | Status: DC | PRN
  Administered 2014-10-01: 50 ug via INTRAVENOUS

## 2014-10-01 MED ORDER — PROPOFOL 10 MG/ML IV BOLUS
INTRAVENOUS | Status: AC
  Filled 2014-10-01: qty 20

## 2014-10-01 MED ORDER — PROPOFOL 10 MG/ML IV BOLUS
INTRAVENOUS | Status: DC | PRN
  Administered 2014-10-01: 150 mg via INTRAVENOUS

## 2014-10-01 MED ORDER — SODIUM CHLORIDE 0.9 % IR SOLN
Status: DC | PRN
  Administered 2014-10-01: 3000 mL

## 2014-10-01 SURGICAL SUPPLY — 18 items
CANISTERS HI-FLOW 3000CC (CANNISTER) ×4 IMPLANT
CATH ROBINSON RED A/P 16FR (CATHETERS) ×4 IMPLANT
CLOTH BEACON ORANGE TIMEOUT ST (SAFETY) ×4 IMPLANT
CONTAINER PREFILL 10% NBF 60ML (FORM) ×8 IMPLANT
DEVICE MYOSURE CLASSIC (MISCELLANEOUS) IMPLANT
DEVICE MYOSURE LITE (MISCELLANEOUS) IMPLANT
DILATOR CANAL MILEX (MISCELLANEOUS) IMPLANT
GLOVE BIOGEL PI IND STRL 6.5 (GLOVE) ×4 IMPLANT
GLOVE BIOGEL PI INDICATOR 6.5 (GLOVE) ×4
GLOVE ECLIPSE 6.5 STRL STRAW (GLOVE) ×4 IMPLANT
GOWN STRL REUS W/TWL LRG LVL3 (GOWN DISPOSABLE) ×8 IMPLANT
MYOSURE XL FIBROID REM (MISCELLANEOUS)
PACK VAGINAL MINOR WOMEN LF (CUSTOM PROCEDURE TRAY) ×4 IMPLANT
PAD OB MATERNITY 4.3X12.25 (PERSONAL CARE ITEMS) ×4 IMPLANT
SEAL ROD LENS SCOPE MYOSURE (ABLATOR) ×4 IMPLANT
SYRINGE 10CC LL (SYRINGE) ×4 IMPLANT
SYSTEM TISS REMOVAL MYSR XL RM (MISCELLANEOUS) IMPLANT
TOWEL OR 17X24 6PK STRL BLUE (TOWEL DISPOSABLE) ×8 IMPLANT

## 2014-10-01 NOTE — Anesthesia Preprocedure Evaluation (Addendum)
Anesthesia Evaluation  Patient identified by MRN, date of birth, ID band Patient awake    Reviewed: Allergy & Precautions, NPO status , Patient's Chart, lab work & pertinent test results, reviewed documented beta blocker date and time   Airway Mallampati: II   Neck ROM: Full    Dental  (+) Partial Upper, Dental Advisory Given, Teeth Intact   Pulmonary former smoker (quit 10 years ago),  breath sounds clear to auscultation        Cardiovascular Rhythm:Regular  EKG 09/2014 ST changes   Neuro/Psych    GI/Hepatic negative GI ROS, Neg liver ROS,   Endo/Other  negative endocrine ROS  Renal/GU negative Renal ROS     Musculoskeletal  (+) Arthritis -,   Abdominal (+)  Abdomen: soft.    Peds  Hematology   Anesthesia Other Findings   Reproductive/Obstetrics                            Anesthesia Physical Anesthesia Plan  ASA: II  Anesthesia Plan: General   Post-op Pain Management:    Induction: Intravenous  Airway Management Planned: LMA  Additional Equipment:   Intra-op Plan:   Post-operative Plan: Extubation in OR  Informed Consent: I have reviewed the patients History and Physical, chart, labs and discussed the procedure including the risks, benefits and alternatives for the proposed anesthesia with the patient or authorized representative who has indicated his/her understanding and acceptance.     Plan Discussed with:   Anesthesia Plan Comments:         Anesthesia Quick Evaluation

## 2014-10-01 NOTE — Interval H&P Note (Signed)
History and Physical Interval Note:  10/01/2014 1:18 PM  Lauren Terrell  has presented today for surgery, with the diagnosis of  Abnormal Uterine Bleeding  The various methods of treatment have been discussed with the patient and family. After consideration of risks, benefits and other options for treatment, the patient has consented to  Procedure(s): DILATATION & CURETTAGE/HYSTEROSCOPY WITH POSSIBLE MYOSURE (N/A) as a surgical intervention .  The patient's history has been reviewed, patient examined, no change in status, stable for surgery.  I have reviewed the patient's chart and labs.  Questions were answered to the patient's satisfaction.     Myna HidalgoZAN, Mailey Landstrom, M

## 2014-10-01 NOTE — Discharge Instructions (Addendum)
HOME INSTRUCTIONS  Please note any unusual or excessive bleeding, pain, swelling. Mild dizziness or drowsiness are normal for about 24 hours after surgery.   Shower when comfortable  Restrictions: No driving for 24 hours or while taking pain medications.  Activity:  No heavy lifting (> 20 lbs), nothing in vagina (no tampons, douching, or intercourse) x 2 weeks; no tub baths for 2 weeks Vaginal spotting is expected but if your bleeding is heavy, period like,  please call the office   Incision: the bandaids will fall off when they are ready to; you may clean your incision with mild soap and water but do not rub or scrub the incision site.  You may experience slight bloody drainage from your incision periodically.  This is normal.  If you experience a large amount of drainage or the incision opens, please call your physician who will likely direct you to the emergency department.  Diet:  You may return to your regular diet.  Do not eat large meals.  Eat small frequent meals throughout the day.  Continue to drink a good amount of water at least 6-8 glasses of water per day, hydration is very important for the healing process.  Pain Management: Take Motrin as needed for pain.  For severe cramping, may take Percocet every 8hr as needed.  Always take prescription pain medication with food, it may cause constipation, increase fluids and fiber and you may want to take an over-the-counter stool softener like Colace as needed up to 2x a day.    Alcohol -- Avoid for 24 hours and while taking pain medications.  Nausea: Take sips of ginger ale or soda  Fever -- Call physician if temperature over 101 degrees  Follow up:  If you do not already have a follow up appointment scheduled, please call the office at [phone].  If you experience fever (a temperature greater than 100.4), pain unrelieved by pain medication, shortness of breath, swelling of a single leg, or any other symptoms which are concerning  to you please the office immediately.   Post Anesthesia Home Care Instructions  Activity: Get plenty of rest for the remainder of the day. A responsible adult should stay with you for 24 hours following the procedure.  For the next 24 hours, DO NOT: -Drive a car -Advertising copywriterperate machinery -Drink alcoholic beverages -Take any medication unless instructed by your physician -Make any legal decisions or sign important papers.  Meals: Start with liquid foods such as gelatin or soup. Progress to regular foods as tolerated. Avoid greasy, spicy, heavy foods. If nausea and/or vomiting occur, drink only clear liquids until the nausea and/or vomiting subsides. Call your physician if vomiting continues.  Special Instructions/Symptoms: Your throat may feel dry or sore from the anesthesia or the breathing tube placed in your throat during surgery. If this causes discomfort, gargle with warm salt water. The discomfort should disappear within 24 hours.  If you had a scopolamine patch placed behind your ear for the management of post- operative nausea and/or vomiting:  1. The medication in the patch is effective for 72 hours, after which it should be removed.  Wrap patch in a tissue and discard in the trash. Wash hands thoroughly with soap and water. 2. You may remove the patch earlier than 72 hours if you experience unpleasant side effects which may include dry mouth, dizziness or visual disturbances. 3. Avoid touching the patch. Wash your hands with soap and water after contact with the patch.

## 2014-10-01 NOTE — Anesthesia Postprocedure Evaluation (Signed)
  Anesthesia Post-op Note  Patient: Lauren Terrell  Procedure(s) Performed: Procedure(s): DILATATION AND CURETTAGE (N/A) HYSTEROSCOPY WITH MYOSURE (N/A)  Patient Location: PACU  Anesthesia Type:General  Level of Consciousness: awake and alert   Airway and Oxygen Therapy: Patient Spontanous Breathing and Patient connected to nasal cannula oxygen  Post-op Pain: mild  Post-op Assessment: Post-op Vital signs reviewed, Patient's Cardiovascular Status Stable, Respiratory Function Stable and Patent Airway  Post-op Vital Signs: Reviewed and stable  Last Vitals: There were no vitals filed for this visit.  Complications: No apparent anesthesia complications

## 2014-10-01 NOTE — Transfer of Care (Signed)
Immediate Anesthesia Transfer of Care Note  Patient: Lauren Terrell  Procedure(s) Performed: Procedure(s): DILATATION AND CURETTAGE (N/A) HYSTEROSCOPY WITH MYOSURE (N/A)  Patient Location: PACU  Anesthesia Type:General  Level of Consciousness: awake, alert  and sedated  Airway & Oxygen Therapy: Patient Spontanous Breathing and Patient connected to nasal cannula oxygen  Post-op Assessment: Report given to RN and Post -op Vital signs reviewed and stable  Post vital signs: Reviewed and stable  Last Vitals: There were no vitals filed for this visit.  Complications: No apparent anesthesia complications

## 2014-10-01 NOTE — Anesthesia Procedure Notes (Signed)
Procedure Name: LMA Insertion Date/Time: 10/01/2014 2:06 PM Performed by: Cleda ClarksBROWDER, Lauren Terrell Pre-anesthesia Checklist: Patient identified, Timeout performed, Emergency Drugs available, Patient being monitored and Suction available Patient Re-evaluated:Patient Re-evaluated prior to inductionOxygen Delivery Method: Circle system utilized Preoxygenation: Pre-oxygenation with 100% oxygen Intubation Type: IV induction Ventilation: Mask ventilation without difficulty LMA: LMA inserted LMA Size: 4.0 Number of attempts: 1 Tube secured with: Tape Dental Injury: Teeth and Oropharynx as per pre-operative assessment

## 2014-10-01 NOTE — Op Note (Signed)
Operative Report  PreOp: postmenopausal bleeding PostOp: same, uterine polyp Procedure:  Hysteroscopy, Dilation and Curettage, polypectomy Surgeon: Dr. Myna HidalgoJennifer Dangela How Anesthesia: General Complications:none EBL: 5mL  Findings: Anteverted uterus, 8wk sized uterus, thin endometrium except for 1cm polyp on posterior wall.  Both ostia visualized.  Specimens: 1) endocervical curetting 2) endometrial curetting  Procedure: The patient was taken to the operating room where she underwent general anesthesia without difficulty. The patient was placed in a low lithotomy position using Allen stirrups. She was then prepped and draped in the normal sterile fashion. The bladder was drained using a red rubber urethral catheter. A sterile speculum was inserted into the vagina. A single tooth tenaculum was placed on the anterior lip of the cervix. ECC was collected.  The uterus was sounded to 8cm. The endocervical canal was then serially dilated to 16French using Hank dilators.  The diagnostic hysteroscope was then inserted without difficulty and noted to have the findings as listed above.  Visualization was achieved using NS as a distending medium. The myosure device was used and the polyp was dissected without difficulty.  The hysteroscope was removed and sharp curettage was performed. The tissue was sent to pathology. Hysteroscope was reinserted and no uterine perforation was seen. All instrument were then removed. Hemostasis was observed at the cervical site. The patient was repositioned to the supine position. The patient tolerated the procedure without any complications and taken to recovery in stable condition.   Myna HidalgoJennifer Maybelline Kolarik, DO [phone] (pager) [phone] (office)

## 2014-10-02 ENCOUNTER — Encounter (HOSPITAL_COMMUNITY): Payer: Self-pay | Admitting: Obstetrics & Gynecology

## 2014-11-07 DIAGNOSIS — L259 Unspecified contact dermatitis, unspecified cause: Secondary | ICD-10-CM | POA: Diagnosis not present

## 2014-11-19 DIAGNOSIS — T148 Other injury of unspecified body region: Secondary | ICD-10-CM | POA: Diagnosis not present

## 2015-03-03 ENCOUNTER — Emergency Department (INDEPENDENT_AMBULATORY_CARE_PROVIDER_SITE_OTHER)
Admission: EM | Admit: 2015-03-03 | Discharge: 2015-03-03 | Disposition: A | Discharge status: Home / Self Care | Payer: Medicare Other | Source: Home / Self Care | Attending: Emergency Medicine | Admitting: Emergency Medicine

## 2015-03-03 ENCOUNTER — Encounter (HOSPITAL_COMMUNITY): Payer: Self-pay | Admitting: Emergency Medicine

## 2015-03-03 DIAGNOSIS — S0081XA Abrasion of other part of head, initial encounter: Secondary | ICD-10-CM | POA: Diagnosis not present

## 2015-03-03 NOTE — ED Provider Notes (Signed)
CSN: [id number]     Arrival date & time 03/03/15  1646 History   First MD Initiated Contact with Patient 03/03/15 1821     Chief Complaint  Patient presents with  . Fall  . Abrasion   (Consider location/radiation/quality/duration/timing/severity/associated sxs/prior Treatment) HPI  She is a 74 year old woman here for evaluation of facial abrasion after a fall. She states she fell yesterday afternoon walking down her driveway. She states she was wearing clogs and thinks she got off balance and fell, scraping her left cheekbone. She denies any dizziness, chest pain, palpitations prior to the fall. She denies any loss of consciousness. She cleaned the abrasion with water. She applied a cold compress. She dressed it with some gauze. She is here today because the gauze has adhered to the abrasion and she cannot get it off.  Past Medical History  Diagnosis Date  . Arthritis   . Fibromyalgia    Past Surgical History  Procedure Laterality Date  . Bronchoscopy    . Cesarean section    . Tonsillectomy    . Cataracts    . Dilation and curettage of uterus N/A 10/01/2014    Procedure: DILATATION AND CURETTAGE;  Surgeon: Myna Hidalgo, DO;  Location: [facility];  Service: Gynecology;  Laterality: N/A;  . Hysteroscopy N/A 10/01/2014    Procedure: HYSTEROSCOPY WITH MYOSURE;  Surgeon: Myna Hidalgo, DO;  Location: [facility];  Service: Gynecology;  Laterality: N/A;   No family history on file. Social History  Substance Use Topics  . Smoking status: Former Games developer  . Smokeless tobacco: None  . Alcohol Use: Yes     Comment: nightly   OB History    No data available     Review of Systems As in history of present illness Allergies  Penicillins  Home Medications   Prior to Admission medications   Medication Sig Start Date End Date Taking? Authorizing Provider  diclofenac (VOLTAREN) 75 MG EC tablet Take 75 mg by mouth at bedtime.   Yes Historical Provider, MD  MELATONIN PO Take 1 tablet by mouth at  bedtime.   Yes Historical Provider, MD  pregabalin (LYRICA) 75 MG capsule Take 75 mg by mouth at bedtime.    Yes Historical Provider, MD  oxyCODONE-acetaminophen (ROXICET) 5-325 MG per tablet Take 1 tablet by mouth every 8 (eight) hours as needed for severe pain. 10/01/14   Myna Hidalgo, DO  Polyethyl Glycol-Propyl Glycol (SYSTANE OP) Apply 1 drop to eye as needed (dry eyes).    Historical Provider, MD   Meds Ordered and Administered this Visit  Medications - No data to display  BP 148/75 mmHg  Pulse 51  Temp(Src) 97.7 F (36.5 C) (Oral)  Resp 16  SpO2 100% No data found.   Physical Exam  Constitutional: She is oriented to person, place, and time. She appears well-developed and well-nourished.  HENT:  3 x 4 cm superficial abrasion to the left lateral cheek bone. There is some associated swelling. Gauze bandage adhered to wound. Sterile saline was used to soak the gauze and gently removed. Abrasion is superficial.  Cardiovascular: Normal rate.   Pulmonary/Chest: Effort normal.  Neurological: She is alert and oriented to person, place, and time.    ED Course  Procedures (including critical care time)  Labs Review Labs Reviewed - No data to display  Imaging Review No results found.    MDM   1. Facial abrasion, initial encounter    Dressing applied. Wound care discussed as in after visit summary. Follow-up  as needed.    Charm Rings, MD 03/03/15 [phone]

## 2015-03-03 NOTE — Discharge Instructions (Signed)
The abrasion looks nice and clean. This will heal well. Leave the bandage on until tomorrow morning. Wash the abrasion with soap and water twice a day. Do not let the shower spray directly on the face. Apply Neosporin ointment twice a day for the next 2-3 days. Apply ice to the abrasion to help with the swelling. After tonight, keep it uncovered so it will heal better.

## 2015-03-03 NOTE — ED Notes (Signed)
Pt reports she fell yest onto concrete; has an abrasion to left side of face Sx include swelling, bruising and painful Also reports she applied a 2x2 gauze to abrasion that has now dried into abrasion and she's unable to remove it b/c it's painful Denies LOC Alert and oriented x4... No acute distress.

## 2015-03-13 DIAGNOSIS — M797 Fibromyalgia: Secondary | ICD-10-CM | POA: Diagnosis not present

## 2015-03-13 DIAGNOSIS — G4700 Insomnia, unspecified: Secondary | ICD-10-CM | POA: Diagnosis not present

## 2015-04-15 DIAGNOSIS — Z23 Encounter for immunization: Secondary | ICD-10-CM | POA: Diagnosis not present

## 2015-06-05 DIAGNOSIS — L659 Nonscarring hair loss, unspecified: Secondary | ICD-10-CM | POA: Diagnosis not present

## 2015-06-05 DIAGNOSIS — R269 Unspecified abnormalities of gait and mobility: Secondary | ICD-10-CM | POA: Diagnosis not present

## 2015-08-03 DIAGNOSIS — B009 Herpesviral infection, unspecified: Secondary | ICD-10-CM | POA: Diagnosis not present

## 2015-12-09 DIAGNOSIS — Z961 Presence of intraocular lens: Secondary | ICD-10-CM | POA: Diagnosis not present

## 2015-12-09 DIAGNOSIS — H04123 Dry eye syndrome of bilateral lacrimal glands: Secondary | ICD-10-CM | POA: Diagnosis not present

## 2015-12-09 DIAGNOSIS — H469 Unspecified optic neuritis: Secondary | ICD-10-CM | POA: Diagnosis not present

## 2015-12-09 DIAGNOSIS — H10413 Chronic giant papillary conjunctivitis, bilateral: Secondary | ICD-10-CM | POA: Diagnosis not present

## 2015-12-09 DIAGNOSIS — H26492 Other secondary cataract, left eye: Secondary | ICD-10-CM | POA: Diagnosis not present

## 2016-04-01 DIAGNOSIS — M797 Fibromyalgia: Secondary | ICD-10-CM | POA: Diagnosis not present

## 2016-04-01 DIAGNOSIS — Z23 Encounter for immunization: Secondary | ICD-10-CM | POA: Diagnosis not present

## 2017-03-12 ENCOUNTER — Emergency Department (HOSPITAL_BASED_OUTPATIENT_CLINIC_OR_DEPARTMENT_OTHER)
Admission: EM | Admit: 2017-03-12 | Discharge: 2017-03-12 | Disposition: A | Discharge status: Home / Self Care | Payer: Medicare Other | Attending: Emergency Medicine | Admitting: Emergency Medicine

## 2017-03-12 ENCOUNTER — Emergency Department (HOSPITAL_BASED_OUTPATIENT_CLINIC_OR_DEPARTMENT_OTHER): Payer: Medicare Other

## 2017-03-12 ENCOUNTER — Encounter (HOSPITAL_BASED_OUTPATIENT_CLINIC_OR_DEPARTMENT_OTHER): Payer: Self-pay | Admitting: Emergency Medicine

## 2017-03-12 DIAGNOSIS — R1084 Generalized abdominal pain: Secondary | ICD-10-CM | POA: Diagnosis present

## 2017-03-12 DIAGNOSIS — Z79899 Other long term (current) drug therapy: Secondary | ICD-10-CM | POA: Insufficient documentation

## 2017-03-12 DIAGNOSIS — Z87891 Personal history of nicotine dependence: Secondary | ICD-10-CM | POA: Insufficient documentation

## 2017-03-12 LAB — CBC WITH DIFFERENTIAL/PLATELET
Basophils Absolute: 0 10*3/uL (ref 0.0–0.1)
Basophils Relative: 0 %
EOS PCT: 0 %
Eosinophils Absolute: 0 10*3/uL (ref 0.0–0.7)
HCT: 40.9 % (ref 36.0–46.0)
Hemoglobin: 14.2 g/dL (ref 12.0–15.0)
LYMPHS ABS: 1.8 10*3/uL (ref 0.7–4.0)
Lymphocytes Relative: 21 %
MCH: 33.5 pg (ref 26.0–34.0)
MCHC: 34.7 g/dL (ref 30.0–36.0)
MCV: 96.5 fL (ref 78.0–100.0)
Monocytes Absolute: 0.5 10*3/uL (ref 0.1–1.0)
Monocytes Relative: 6 %
NEUTROS ABS: 6 10*3/uL (ref 1.7–7.7)
NEUTROS PCT: 73 %
Platelets: 166 10*3/uL (ref 150–400)
RBC: 4.24 MIL/uL (ref 3.87–5.11)
RDW: 14.9 % (ref 11.5–15.5)
WBC: 8.4 10*3/uL (ref 4.0–10.5)

## 2017-03-12 LAB — COMPREHENSIVE METABOLIC PANEL
ALT: 32 U/L (ref 14–54)
AST: 42 U/L — ABNORMAL HIGH (ref 15–41)
Albumin: 4.3 g/dL (ref 3.5–5.0)
Alkaline Phosphatase: 87 U/L (ref 38–126)
Anion gap: 14 (ref 5–15)
BUN: 18 mg/dL (ref 6–20)
CO2: 19 mmol/L — ABNORMAL LOW (ref 22–32)
Calcium: 8.7 mg/dL — ABNORMAL LOW (ref 8.9–10.3)
Chloride: 106 mmol/L (ref 101–111)
Creatinine, Ser: 0.83 mg/dL (ref 0.44–1.00)
GFR calc Af Amer: >60 mL/min (ref >60)
Glucose, Bld: 77 mg/dL (ref 65–99)
Potassium: 4.5 mmol/L (ref 3.5–5.1)
Sodium: 139 mmol/L (ref 135–145)
Total Bilirubin: 0.9 mg/dL (ref 0.3–1.2)
Total Protein: 7.6 g/dL (ref 6.5–8.1)

## 2017-03-12 LAB — LIPASE, BLOOD: Lipase: 33 U/L (ref 11–51)

## 2017-03-12 MED ORDER — ONDANSETRON 4 MG PO TBDP: ORAL_TABLET | ORAL | 0 refills | Status: AC

## 2017-03-12 MED ORDER — ONDANSETRON HCL 4 MG/2ML IJ SOLN
4.0000 mg | Freq: Once | INTRAMUSCULAR | Status: AC
  Administered 2017-03-12: 4 mg via INTRAVENOUS
  Filled 2017-03-12: qty 2

## 2017-03-12 MED ORDER — IOPAMIDOL (ISOVUE-300) INJECTION 61%
100.0000 mL | Freq: Once | INTRAVENOUS | Status: AC | PRN
  Administered 2017-03-12: 100 mL via INTRAVENOUS

## 2017-03-12 MED ORDER — SODIUM CHLORIDE 0.9 % IV BOLUS (SEPSIS)
1000.0000 mL | Freq: Once | INTRAVENOUS | Status: AC
  Administered 2017-03-12: 1000 mL via INTRAVENOUS

## 2017-03-12 NOTE — ED Provider Notes (Signed)
[facility] Provider Note   CSN: [id number] Arrival date & time: 03/12/17  1191     History   Chief Complaint Chief Complaint  Patient presents with  . Abdominal Pain    HPI Lauren Terrell is a 76 y.o. female.  76 yo F with diffuse abdominal pain.  Going on for three days. Denies fevers, had some chills.  Vomiting yesterday but resolved.  It hard for her to describe the pain. Diffuse about the abdomen. Nothing seems to make it better or worse. She did have some improvement this morning after a bout of vomiting and a bowel movement. She denies dark stool or blood in her stool. Denies prior abdominal surgery.   The history is provided by the patient.  Abdominal Pain   This is a new problem. The current episode started 2 days ago. The problem occurs constantly. The problem has been resolved. The pain is associated with an unknown factor. The pain is located in the generalized abdominal region. The quality of the pain is sharp, shooting and aching. The pain is at a severity of 9/10. The pain is severe. Associated symptoms include nausea and vomiting. Pertinent negatives include fever, dysuria, headaches, arthralgias and myalgias. Nothing aggravates the symptoms. The symptoms are relieved by vomiting and bowel activity.    Past Medical History:  Diagnosis Date  . Arthritis   . Fibromyalgia     There are no active problems to display for this patient.   Past Surgical History:  Procedure Laterality Date  . BRONCHOSCOPY    . cataracts    . CESAREAN SECTION    . DILATION AND CURETTAGE OF UTERUS N/A 10/01/2014   Procedure: DILATATION AND CURETTAGE;  Surgeon: Myna Hidalgo, DO;  Location: [facility];  Service: Gynecology;  Laterality: N/A;  . HYSTEROSCOPY N/A 10/01/2014   Procedure: HYSTEROSCOPY WITH MYOSURE;  Surgeon: Myna Hidalgo, DO;  Location: [facility];  Service: Gynecology;  Laterality: N/A;  . TONSILLECTOMY      OB History    No data available       Home  Medications    Prior to Admission medications   Medication Sig Start Date End Date Taking? Authorizing Provider  diclofenac (VOLTAREN) 75 MG EC tablet Take 75 mg by mouth at bedtime.    [provider]  MELATONIN PO Take 1 tablet by mouth at bedtime.    [provider]  ondansetron (ZOFRAN ODT) 4 MG disintegrating tablet  ODT q4 hours prn nausea/vomit 03/12/17   Melene Plan, DO  oxyCODONE-acetaminophen (ROXICET) 5-325 MG per tablet Take 1 tablet by mouth every 8 (eight) hours as needed for severe pain. 10/01/14   Myna Hidalgo, DO  Polyethyl Glycol-Propyl Glycol (SYSTANE OP) Apply 1 drop to eye as needed (dry eyes).    [provider]  pregabalin (LYRICA) 75 MG capsule Take 75 mg by mouth at bedtime.     [provider]    Family History History reviewed. No pertinent family history.  Social History Social History  Substance Use Topics  . Smoking status: Former Games developer  . Smokeless tobacco: Never Used  . Alcohol use Yes     Comment: nightly     Allergies   Penicillins   Review of Systems Review of Systems  Constitutional: Negative for chills and fever.  HENT: Negative for congestion and rhinorrhea.   Eyes: Negative for redness and visual disturbance.  Respiratory: Negative for shortness of breath and wheezing.   Cardiovascular: Negative for chest pain and  palpitations.  Gastrointestinal: Positive for abdominal pain, nausea and vomiting.  Genitourinary: Negative for dysuria and urgency.  Musculoskeletal: Negative for arthralgias and myalgias.  Skin: Negative for pallor and wound.  Neurological: Negative for dizziness and headaches.     Physical Exam Updated Vital Signs BP (!) 165/82 (BP Location: Left Arm)   Pulse (!) 58   Temp 98.2 F (36.8 C) (Oral)   Resp 18   Ht  (1.549 m)   Wt 52.2 kg (115 lb)   SpO2 100%   BMI 21.73 kg/m   Physical Exam  Constitutional: She is oriented to person, place, and time. She appears  well-developed and well-nourished. No distress.  HENT:  Head: Normocephalic and atraumatic.  Eyes: Pupils are equal, round, and reactive to light. EOM are normal.  Neck: Normal range of motion. Neck supple.  Cardiovascular: Normal rate and regular rhythm.  Exam reveals no gallop and no friction rub.   No murmur heard. Pulmonary/Chest: Effort normal. She has no wheezes. She has no rales.  Abdominal: Soft. She exhibits no distension and no mass. There is no tenderness. There is no guarding.  Musculoskeletal: She exhibits no edema or tenderness.  Neurological: She is alert and oriented to person, place, and time.  Skin: Skin is warm and dry. She is not diaphoretic.  Psychiatric: She has a normal mood and affect. Her behavior is normal.  Nursing note and vitals reviewed.    ED Treatments / Results  Labs (all labs ordered are listed, but only abnormal results are displayed) Labs Reviewed  COMPREHENSIVE METABOLIC PANEL - Abnormal; Notable for the following:       Result Value   CO2 19 (*)    Calcium 8.7 (*)    AST 42 (*)    All other components within normal limits  CBC WITH DIFFERENTIAL/PLATELET  LIPASE, BLOOD    EKG  EKG Interpretation None       Radiology Ct Abdomen Pelvis W Contrast  Result Date: 03/12/2017 CLINICAL DATA:  Abdominal pain. EXAM: CT ABDOMEN AND PELVIS WITH CONTRAST TECHNIQUE: Multidetector CT imaging of the abdomen and pelvis was performed using the standard protocol following bolus administration of intravenous contrast. CONTRAST:  ISOVUE-300 IOPAMIDOL (ISOVUE-300) INJECTION 61% COMPARISON:  06/17/2009 FINDINGS: Lower chest: No acute abnormality. Hepatobiliary: 11 mm cyst in the left lobe of liver noted. Small low attenuation structure in the lateral segment of left lobe measures 4 mm and is too small to characterize. The gallbladder is normal. No biliary dilatation. Pancreas: The pancreas is unremarkable. Spleen: Normal in size without focal abnormality.  Adrenals/Urinary Tract: The adrenal glands are normal. Bilateral renal cortical scarring is identified. No mass or hydronephrosis. The urinary bladder appears normal. Stomach/Bowel: The stomach is normal. The small bowel loops have a normal course and caliber. No obstruction peer the appendix is visualized and appears normal. Normal appearance of the colon. Vascular/Lymphatic: Aortic atherosclerosis. No aneurysm. No adenopathy within the upper abdomen. No pelvic or inguinal adenopathy. Reproductive: The uterus and adnexal structures are unremarkable. Other: No free fluid or fluid collections. Musculoskeletal: No aggressive lytic or sclerotic bone lesions. There is degenerative disc disease identified at L4-5 and L5-S1. IMPRESSION: 1. No acute findings within the abdomen or pelvis. 2. Liver cyst. 3.  Aortic Atherosclerosis (ICD10-I70.0). Electronically Signed   By: Signa Kell M.D.   On: 03/12/2017 21:35    Procedures Procedures (including critical care time)  Medications Ordered in ED Medications  sodium chloride 0.9 % bolus 1,000 mL (1,000 mLs  Intravenous New Bag/Given 03/12/17 1912)  ondansetron [facility]) injection 4 mg (4 mg Intravenous Given 03/12/17 1919)  iopamidol (ISOVUE-300) 61 % injection 100 mL (100 mLs Intravenous Contrast Given 03/12/17 2041)     Initial Impression / Assessment and Plan / ED Course  I have reviewed the triage vital signs and the nursing notes.  Pertinent labs & imaging results that were available during my care of the patient were reviewed by me and considered in my medical decision making (see chart for details).     76 yo F With a chief complaint of a fused abdominal pain. Resolved upon my exam. Will obtain a CT.  Labs and CT unremarkable.  D/c home.   9:46 PM:  I have discussed the diagnosis/risks/treatment options with the patient and family and believe the pt to be eligible for discharge home to follow-up with PCP. We also discussed returning to the ED  immediately if new or worsening sx occur. We discussed the sx which are most concerning (e.g., sudden worsening pain, fever, inability to tolerate by mouth) that necessitate immediate return. Medications administered to the patient during their visit and any new prescriptions provided to the patient are listed below.  Medications given during this visit Medications  sodium chloride 0.9 % bolus 1,000 mL (1,000 mLs Intravenous New Bag/Given 03/12/17 1912)  ondansetron (ZOFRAN) injection 4 mg (4 mg Intravenous Given 03/12/17 1919)  iopamidol (ISOVUE-300) 61 % injection 100 mL (100 mLs Intravenous Contrast Given 03/12/17 2041)     The patient appears reasonably screen and/or stabilized for discharge and I doubt any other medical condition or other [facility] requiring further screening, evaluation, or treatment in the ED at this time prior to discharge.    Final Clinical Impressions(s) / ED Diagnoses   Final diagnoses:  Generalized abdominal pain    New Prescriptions New Prescriptions   ONDANSETRON (ZOFRAN ODT) 4 MG DISINTEGRATING TABLET     ODT q4 hours prn nausea/vomit     Melene Plan, DO 03/12/17 2146

## 2017-03-12 NOTE — Discharge Instructions (Signed)
Try imodium for diarrhea.

## 2017-03-12 NOTE — ED Triage Notes (Signed)
Patient states that she has had pain to her generalized abdominal area for the last 2 -3 days. She reports N/V
